# Patient Record
Sex: Female | Born: 1975 | Race: White | Hispanic: No | Marital: Single | Smoking: Current every day smoker
Health system: Southern US, Community
[De-identification: ages and names within clinical notes are randomized; demographics above are authoritative.]

## PROBLEM LIST (undated history)

## (undated) DIAGNOSIS — M1990 Unspecified osteoarthritis, unspecified site: Secondary | ICD-10-CM

## (undated) HISTORY — DX: Unspecified osteoarthritis, unspecified site: M19.90

---

## 2002-11-12 HISTORY — PX: RHINOPLASTY: SHX2354

## 2008-03-25 ENCOUNTER — Other Ambulatory Visit: Admission: RE | Admit: 2008-03-25 | Discharge: 2008-03-25 | Payer: Self-pay | Admitting: Obstetrics and Gynecology

## 2008-06-29 ENCOUNTER — Inpatient Hospital Stay (HOSPITAL_COMMUNITY): Admission: AD | Admit: 2008-06-29 | Discharge: 2008-07-01 | Payer: Self-pay | Admitting: Obstetrics and Gynecology

## 2011-03-27 NOTE — Op Note (Signed)
NAMESHALIN, LINDERS             ACCOUNT NO.:  [id number]   MEDICAL RECORD NO.:  [id number]          PATIENT TYPE:  INP   LOCATION:  9108                          FACILITY:  [facility]   PHYSICIAN:  Gerald Leitz, MD          DATE OF BIRTH:  1976/01/27   DATE OF PROCEDURE:  06/29/2008  DATE OF DISCHARGE:                               OPERATIVE REPORT   PREOPERATIVE DIAGNOSES:  1. Term intrauterine pregnancy.  2. Labor.  3. Nonreassuring fetal heart rate.   POSTOPERATIVE DIAGNOSES:  1. Term intrauterine pregnancy.  2. Labor.  3. Nonreassuring fetal heart rate.   PROCEDURE:  Primary low-transverse cesarean section.   SURGEON:  Gerald Leitz, MD   ASSISTANT:  None.   ANESTHESIA:  Spinal.   FINDINGS:  Female infant, cephalic presentation, OP position, vigorous,  Apgars per neonatologist, arterial pH of 7.22.   SPECIMEN:  Placenta.   DISPOSITION:  Labor and Delivery.   ESTIMATED BLOOD LOSS:  700 mL.   URINE OUTPUT:  300 mL.   FLUIDS:  800 mL.   COMPLICATIONS:  None.   INDICATIONS:  A 35 year old G1 who presented with rupture of membranes  and started on Pitocin for augmentation reached a maximal cervical  dilatation of 4 cm, and developed nonreassuring fetal testing by  repetitive deep variables and late decelerations.   PROCEDURE:  Informed consent was obtained.  The patient was taken to the  operating room where she was placed under spinal anesthesia.  She was  prepped and draped in the usual sterile fashion.  A Pfannenstiel skin  incision was made with a scalpel and carried down to the underlying  layer of fascia.  The fascia was incised in the midline and the incision  was extended laterally with Mayo scissors.  Superior aspect of the  fascial incision was elevated and underlying rectus muscles were  dissected off.  This was repeated on the inferior aspect of the fascial  incision.  The rectus muscles were separated in the midline.  The  peritoneum was identified and entered  bluntly.  Alexis abdominal  retractor was inserted.  Vesicouterine peritoneum was identified, tented  up, and entered sharply with Metzenbaum scissors.  The lower uterine  segment was incised transversely.  Infant's head was delivered  atraumatically.  Mouth and nose were bulb suctioned.  Shoulder and body  were delivered.  Cord was clamped x2 and cut.  Infant was handed off to  the waiting neonatologist.  The placenta was expressed.  The uterus was  exteriorized and cleared of all clots and debris.  Uterine incision was  repaired with 0 Vicryl in a running locked fashion.  The patient was  noted to have some bleeding from the left uterine artery.  An O'Leary  stitch was placed.  There was noted to be a hematoma in the left broad  ligament.  This appeared hemostatic and nonexpanding.  Uterus was then  returned to the abdomen.  Abdomen was closely irrigated, cleared of all  clots and debris.  Uterine incision was inspected and appeared  hemostatic.  Alexis retractor was removed.  The fascia was  reapproximated with 0 PDS.  Scarpa fascia was reapproximated with 2-0  Vicryl in a running fashion.  The skin was closed with staples.  Sponge,  lap, and needle counts were correct x2.  Ancef 2 g were given at cord  clamp.  The patient stated she tolerated the surgery well and was taken  to the recovery room in awake and in stable condition.      Gerald Leitz, MD  Electronically Signed     TC/MEDQ  D:  06/29/2008  T:  06/30/2008  Job:  [id number]

## 2011-03-27 NOTE — Discharge Summary (Signed)
Sara Parks, Sara Parks             ACCOUNT NO.:  [id number]   MEDICAL RECORD NO.:  [id number]          PATIENT TYPE:  INP   LOCATION:  9108                          FACILITY:  [facility]   PHYSICIAN:  Gerald Leitz, MD          DATE OF BIRTH:  [birth year]   DATE OF ADMISSION:  06/29/2008  DATE OF DISCHARGE:  07/01/2008                               DISCHARGE SUMMARY   ADMISSION DIAGNOSES:  1. Term intrauterine pregnancy.  2. Spontaneous rupture of membranes.   DISCHARGE DIAGNOSES:  1. Term intrauterine pregnancy.  2. Spontaneous rupture of membranes.  3. Nonreassuring fetal heart tracing.  4. Status post low-transverse cesarean section.  5. Anemia.   BRIEF HOSPITAL COURSE:  The patient was admitted on June 29, 2008,  after reporting rupture of membranes.  She received Pitocin for  augmentation of labor, progressed to maximum dilatation to 4 cm.  Began  having variable decelerations to the 60s and late decelerations,  underwent cesarean section, delivered a live born female infant with  arterial pH of 7.22 at delivery.  Apgars of 8 and 9 at 1 and 5 minutes  respectively, weighed 3450 grams.  The patient did well postoperatively.  She did develop anemia with a hemoglobin of 9.3 on postop day #1.   She was discharged home on the following medications:  Motrin, Percocet,  and iron sulfate.  She is to return on August 24 to [facility] for staple removal.   ACTIVITY:  Pelvic rest.   CONDITION AT DISCHARGE:  Stable.      Gerald Leitz, MD  Electronically Signed     TC/MEDQ  D:  07/01/2008  T:  07/01/2008  Job:  [id number]

## 2011-03-30 NOTE — Discharge Summary (Signed)
Sara Parks, Sara Parks             ACCOUNT NO.:  [id number]   MEDICAL RECORD NO.:  [id number]          PATIENT TYPE:  INP   LOCATION:  9108                          FACILITY:  [facility]   PHYSICIAN:  Gerald Leitz, MD          DATE OF BIRTH:  1976-06-23   DATE OF ADMISSION:  06/29/2008  DATE OF DISCHARGE:  07/01/2008                               DISCHARGE SUMMARY   ADMISSION DIAGNOSES:  1. Term intrauterine pregnancy.  2. Labor.   DISCHARGE DIAGNOSES:  1. Term labor.  2. Labor.  3. Nonreassuring fetal heart rate.  4. Status post low-transverse cesarean section.  5. Anemia.   BRIEF HOSPITAL COURSE:  The patient was admitted on June 29, 2008, in  late labor, she had spontaneous rupture of membranes at 5:30 on June 29, 2008, and presented to [facility], was admitted.  Her  cervix was 2-3 cm.  She received Pitocin for augmentation.  She  developed late decelerations to the 60s with a maximal cervical  dilatation of 4 cm.  Cesarean section was performed.  She delivered a  live born female infant with Apgars of 8 and 9 at 1 and 5 minutes  respectively.  She did well postoperatively with a hemoglobin on postop  day #1 of 9.3.   She is discharged on the following medications; Motrin and Percocet.   Follow up in 1-2 days for staple removal and in 4-6 weeks for postpartum  visit.   CONDITION AT DISCHARGE:  Stable.      Gerald Leitz, MD  Electronically Signed     TC/MEDQ  D:  07/20/2008  T:  07/20/2008  Job:  [id number]

## 2015-06-03 ENCOUNTER — Telehealth: Payer: Self-pay | Admitting: *Deleted

## 2015-06-03 NOTE — Telephone Encounter (Signed)
Unable to reach patient at time of Pre-Visit Call.  Left message for patient to return call when available.    

## 2015-06-06 ENCOUNTER — Ambulatory Visit (INDEPENDENT_AMBULATORY_CARE_PROVIDER_SITE_OTHER): Payer: BLUE CROSS/BLUE SHIELD | Admitting: Physician Assistant

## 2015-06-06 ENCOUNTER — Ambulatory Visit (HOSPITAL_BASED_OUTPATIENT_CLINIC_OR_DEPARTMENT_OTHER)
Admission: RE | Admit: 2015-06-06 | Discharge: 2015-06-06 | Disposition: A | Discharge status: Home / Self Care | Payer: BLUE CROSS/BLUE SHIELD | Source: Ambulatory Visit | Attending: Physician Assistant | Admitting: Physician Assistant

## 2015-06-06 ENCOUNTER — Encounter: Payer: Self-pay | Admitting: Physician Assistant

## 2015-06-06 VITALS — BP 98/70 | HR 87 | Temp 98.3°F | Ht 65.0 in | Wt 155.0 lb

## 2015-06-06 DIAGNOSIS — R202 Paresthesia of skin: Secondary | ICD-10-CM | POA: Diagnosis not present

## 2015-06-06 DIAGNOSIS — M5441 Lumbago with sciatica, right side: Secondary | ICD-10-CM | POA: Diagnosis not present

## 2015-06-06 DIAGNOSIS — R200 Anesthesia of skin: Secondary | ICD-10-CM | POA: Insufficient documentation

## 2015-06-06 DIAGNOSIS — M25512 Pain in left shoulder: Secondary | ICD-10-CM | POA: Diagnosis present

## 2015-06-06 DIAGNOSIS — G8929 Other chronic pain: Secondary | ICD-10-CM | POA: Insufficient documentation

## 2015-06-06 DIAGNOSIS — M67912 Unspecified disorder of synovium and tendon, left shoulder: Secondary | ICD-10-CM

## 2015-06-06 DIAGNOSIS — M545 Low back pain, unspecified: Secondary | ICD-10-CM | POA: Insufficient documentation

## 2015-06-06 DIAGNOSIS — M5442 Lumbago with sciatica, left side: Secondary | ICD-10-CM

## 2015-06-06 MED ORDER — MELOXICAM 15 MG PO TABS: 15.0000 mg | ORAL_TABLET | Freq: Every day | ORAL | Status: DC

## 2015-06-06 NOTE — Progress Notes (Signed)
Pre visit review using our clinic review tool, if applicable. No additional management support is needed unless otherwise documented below in the visit note. 

## 2015-06-06 NOTE — Patient Instructions (Signed)
Please go downstairs for imaging. I will call you with your results.  For shoulder and low back, please limit heavy lifting.  Apply topical Aspercreme to lower back and shoulder. Take Mobic daily as directed. Use Tylenol if needed for breakthrough symptoms.  We will likely need to proceed with MRI lower spine, but must get x-ray first.  Sciatica with Rehab The sciatic nerve runs from the back down the leg and is responsible for sensation and control of the muscles in the back (posterior) side of the thigh, lower leg, and foot. Sciatica is a condition that is characterized by inflammation of this nerve.  SYMPTOMS   Signs of nerve damage, including numbness and/or weakness along the posterior side of the lower extremity.  Pain in the back of the thigh that may also travel down the leg.  Pain that worsens when sitting for long periods of time.  Occasionally, pain in the back or buttock. CAUSES  Inflammation of the sciatic nerve is the cause of sciatica. The inflammation is due to something irritating the nerve. Common sources of irritation include:  Sitting for long periods of time.  Direct trauma to the nerve.  Arthritis of the spine.  Herniated or ruptured disk.  Slipping of the vertebrae (spondylolisthesis).  Pressure from soft tissues, such as muscles or ligament-like tissue (fascia). RISK INCREASES WITH:  Sports that place pressure or stress on the spine (football or weightlifting).  Poor strength and flexibility.  Failure to warm up properly before activity.  Family history of low back pain or disk disorders.  Previous back injury or surgery.  Poor body mechanics, especially when lifting, or poor posture. PREVENTION   Warm up and stretch properly before activity.  Maintain physical fitness:  Strength, flexibility, and endurance.  Cardiovascular fitness.  Learn and use proper technique, especially with posture and lifting. When possible, have coach correct  improper technique.  Avoid activities that place stress on the spine. PROGNOSIS If treated properly, then sciatica usually resolves within 6 weeks. However, occasionally surgery is necessary.  RELATED COMPLICATIONS   Permanent nerve damage, including pain, numbness, tingle, or weakness.  Chronic back pain.  Risks of surgery: infection, bleeding, nerve damage, or damage to surrounding tissues. TREATMENT Treatment initially involves resting from any activities that aggravate your symptoms. The use of ice and medication may help reduce pain and inflammation. The use of strengthening and stretching exercises may help reduce pain with activity. These exercises may be performed at home or with referral to a therapist. A therapist may recommend further treatments, such as transcutaneous electronic nerve stimulation (TENS) or ultrasound. Your caregiver may recommend corticosteroid injections to help reduce inflammation of the sciatic nerve. If symptoms persist despite non-surgical (conservative) treatment, then surgery may be recommended. MEDICATION  If pain medication is necessary, then nonsteroidal anti-inflammatory medications, such as aspirin and ibuprofen, or other minor pain relievers, such as acetaminophen, are often recommended.  Do not take pain medication for 7 days before surgery.  Prescription pain relievers may be given if deemed necessary by your caregiver. Use only as directed and only as much as you need.  Ointments applied to the skin may be helpful.  Corticosteroid injections may be given by your caregiver. These injections should be reserved for the most serious cases, because they may only be given a certain number of times. HEAT AND COLD  Cold treatment (icing) relieves pain and reduces inflammation. Cold treatment should be applied for 10 to 15 minutes every 2 to 3 hours for  inflammation and pain and immediately after any activity that aggravates your symptoms. Use ice packs  or massage the area with a piece of ice (ice massage).  Heat treatment may be used prior to performing the stretching and strengthening activities prescribed by your caregiver, physical therapist, or athletic trainer. Use a heat pack or soak the injury in warm water. SEEK MEDICAL CARE IF:  Treatment seems to offer no benefit, or the condition worsens.  Any medications produce adverse side effects. EXERCISES  RANGE OF MOTION (ROM) AND STRETCHING EXERCISES - Sciatica Most people with sciatic will find that their symptoms worsen with either excessive bending forward (flexion) or arching at the low back (extension). The exercises which will help resolve your symptoms will focus on the opposite motion. Your physician, physical therapist or athletic trainer will help you determine which exercises will be most helpful to resolve your low back pain. Do not complete any exercises without first consulting with your clinician. Discontinue any exercises which worsen your symptoms until you speak to your clinician. If you have pain, numbness or tingling which travels down into your buttocks, leg or foot, the goal of the therapy is for these symptoms to move closer to your back and eventually resolve. Occasionally, these leg symptoms will get better, but your low back pain may worsen; this is typically an indication of progress in your rehabilitation. Be certain to be very alert to any changes in your symptoms and the activities in which you participated in the 24 hours prior to the change. Sharing this information with your clinician will allow him/her to most efficiently treat your condition. These exercises may help you when beginning to rehabilitate your injury. Your symptoms may resolve with or without further involvement from your physician, physical therapist or athletic trainer. While completing these exercises, remember:   Restoring tissue flexibility helps normal motion to return to the joints. This allows  healthier, less painful movement and activity.  An effective stretch should be held for at least 30 seconds.  A stretch should never be painful. You should only feel a gentle lengthening or release in the stretched tissue. FLEXION RANGE OF MOTION AND STRETCHING EXERCISES: STRETCH - Flexion, Single Knee to Chest   Lie on a firm bed or floor with both legs extended in front of you.  Keeping one leg in contact with the floor, bring your opposite knee to your chest. Hold your leg in place by either grabbing behind your thigh or at your knee.  Pull until you feel a gentle stretch in your low back. Hold __________ seconds.  Slowly release your grasp and repeat the exercise with the opposite side. Repeat __________ times. Complete this exercise __________ times per day.  STRETCH - Flexion, Double Knee to Chest  Lie on a firm bed or floor with both legs extended in front of you.  Keeping one leg in contact with the floor, bring your opposite knee to your chest.  Tense your stomach muscles to support your back and then lift your other knee to your chest. Hold your legs in place by either grabbing behind your thighs or at your knees.  Pull both knees toward your chest until you feel a gentle stretch in your low back. Hold __________ seconds.  Tense your stomach muscles and slowly return one leg at a time to the floor. Repeat __________ times. Complete this exercise __________ times per day.  STRETCH - Low Trunk Rotation   Lie on a firm bed or floor. Keeping  your legs in front of you, bend your knees so they are both pointed toward the ceiling and your feet are flat on the floor.  Extend your arms out to the side. This will stabilize your upper body by keeping your shoulders in contact with the floor.  Gently and slowly drop both knees together to one side until you feel a gentle stretch in your low back. Hold for __________ seconds.  Tense your stomach muscles to support your low back as  you bring your knees back to the starting position. Repeat the exercise to the other side. Repeat __________ times. Complete this exercise __________ times per day  EXTENSION RANGE OF MOTION AND FLEXIBILITY EXERCISES: STRETCH - Extension, Prone on Elbows  Lie on your stomach on the floor, a bed will be too soft. Place your palms about shoulder width apart and at the height of your head.  Place your elbows under your shoulders. If this is too painful, stack pillows under your chest.  Allow your body to relax so that your hips drop lower and make contact more completely with the floor.  Hold this position for __________ seconds.  Slowly return to lying flat on the floor. Repeat __________ times. Complete this exercise __________ times per day.  RANGE OF MOTION - Extension, Prone Press Ups  Lie on your stomach on the floor, a bed will be too soft. Place your palms about shoulder width apart and at the height of your head.  Keeping your back as relaxed as possible, slowly straighten your elbows while keeping your hips on the floor. You may adjust the placement of your hands to maximize your comfort. As you gain motion, your hands will come more underneath your shoulders.  Hold this position __________ seconds.  Slowly return to lying flat on the floor. Repeat __________ times. Complete this exercise __________ times per day.  STRENGTHENING EXERCISES - Sciatica  These exercises may help you when beginning to rehabilitate your injury. These exercises should be done near your "sweet spot." This is the neutral, low-back arch, somewhere between fully rounded and fully arched, that is your least painful position. When performed in this safe range of motion, these exercises can be used for people who have either a flexion or extension based injury. These exercises may resolve your symptoms with or without further involvement from your physician, physical therapist or athletic trainer. While completing  these exercises, remember:   Muscles can gain both the endurance and the strength needed for everyday activities through controlled exercises.  Complete these exercises as instructed by your physician, physical therapist or athletic trainer. Progress with the resistance and repetition exercises only as your caregiver advises.  You may experience muscle soreness or fatigue, but the pain or discomfort you are trying to eliminate should never worsen during these exercises. If this pain does worsen, stop and make certain you are following the directions exactly. If the pain is still present after adjustments, discontinue the exercise until you can discuss the trouble with your clinician. STRENGTHENING - Deep Abdominals, Pelvic Tilt   Lie on a firm bed or floor. Keeping your legs in front of you, bend your knees so they are both pointed toward the ceiling and your feet are flat on the floor.  Tense your lower abdominal muscles to press your low back into the floor. This motion will rotate your pelvis so that your tail bone is scooping upwards rather than pointing at your feet or into the floor.  With a  gentle tension and even breathing, hold this position for __________ seconds. Repeat __________ times. Complete this exercise __________ times per day.  STRENGTHENING - Abdominals, Crunches   Lie on a firm bed or floor. Keeping your legs in front of you, bend your knees so they are both pointed toward the ceiling and your feet are flat on the floor. Cross your arms over your chest.  Slightly tip your chin down without bending your neck.  Tense your abdominals and slowly lift your trunk high enough to just clear your shoulder blades. Lifting higher can put excessive stress on the low back and does not further strengthen your abdominal muscles.  Control your return to the starting position. Repeat __________ times. Complete this exercise __________ times per day.  STRENGTHENING - Quadruped, Opposite  UE/LE Lift  Assume a hands and knees position on a firm surface. Keep your hands under your shoulders and your knees under your hips. You may place padding under your knees for comfort.  Find your neutral spine and gently tense your abdominal muscles so that you can maintain this position. Your shoulders and hips should form a rectangle that is parallel with the floor and is not twisted.  Keeping your trunk steady, lift your right hand no higher than your shoulder and then your left leg no higher than your hip. Make sure you are not holding your breath. Hold this position __________ seconds.  Continuing to keep your abdominal muscles tense and your back steady, slowly return to your starting position. Repeat with the opposite arm and leg. Repeat __________ times. Complete this exercise __________ times per day.  STRENGTHENING - Abdominals and Quadriceps, Straight Leg Raise   Lie on a firm bed or floor with both legs extended in front of you.  Keeping one leg in contact with the floor, bend the other knee so that your foot can rest flat on the floor.  Find your neutral spine, and tense your abdominal muscles to maintain your spinal position throughout the exercise.  Slowly lift your straight leg off the floor about 6 inches for a count of 15, making sure to not hold your breath.  Still keeping your neutral spine, slowly lower your leg all the way to the floor. Repeat this exercise with each leg __________ times. Complete this exercise __________ times per day. POSTURE AND BODY MECHANICS CONSIDERATIONS - Sciatica Keeping correct posture when sitting, standing or completing your activities will reduce the stress put on different body tissues, allowing injured tissues a chance to heal and limiting painful experiences. The following are general guidelines for improved posture. Your physician or physical therapist will provide you with any instructions specific to your needs. While reading these  guidelines, remember:  The exercises prescribed by your provider will help you have the flexibility and strength to maintain correct postures.  The correct posture provides the optimal environment for your joints to work. All of your joints have less wear and tear when properly supported by a spine with good posture. This means you will experience a healthier, less painful body.  Correct posture must be practiced with all of your activities, especially prolonged sitting and standing. Correct posture is as important when doing repetitive low-stress activities (typing) as it is when doing a single heavy-load activity (lifting). RESTING POSITIONS Consider which positions are most painful for you when choosing a resting position. If you have pain with flexion-based activities (sitting, bending, stooping, squatting), choose a position that allows you to rest in a less  flexed posture. You would want to avoid curling into a fetal position on your side. If your pain worsens with extension-based activities (prolonged standing, working overhead), avoid resting in an extended position such as sleeping on your stomach. Most people will find more comfort when they rest with their spine in a more neutral position, neither too rounded nor too arched. Lying on a non-sagging bed on your side with a pillow between your knees, or on your back with a pillow under your knees will often provide some relief. Keep in mind, being in any one position for a prolonged period of time, no matter how correct your posture, can still lead to stiffness. PROPER SITTING POSTURE In order to minimize stress and discomfort on your spine, you must sit with correct posture Sitting with good posture should be effortless for a healthy body. Returning to good posture is a gradual process. Many people can work toward this most comfortably by using various supports until they have the flexibility and strength to maintain this posture on their  own. When sitting with proper posture, your ears will fall over your shoulders and your shoulders will fall over your hips. You should use the back of the chair to support your upper back. Your low back will be in a neutral position, just slightly arched. You may place a small pillow or folded towel at the base of your low back for support.  When working at a desk, create an environment that supports good, upright posture. Without extra support, muscles fatigue and lead to excessive strain on joints and other tissues. Keep these recommendations in mind: CHAIR:   A chair should be able to slide under your desk when your back makes contact with the back of the chair. This allows you to work closely.  The chair's height should allow your eyes to be level with the upper part of your monitor and your hands to be slightly lower than your elbows. BODY POSITION  Your feet should make contact with the floor. If this is not possible, use a foot rest.  Keep your ears over your shoulders. This will reduce stress on your neck and low back. INCORRECT SITTING POSTURES   If you are feeling tired and unable to assume a healthy sitting posture, do not slouch or slump. This puts excessive strain on your back tissues, causing more damage and pain. Healthier options include:  Using more support, like a lumbar pillow.  Switching tasks to something that requires you to be upright or walking.  Talking a brief walk.  Lying down to rest in a neutral-spine position. PROLONGED STANDING WHILE SLIGHTLY LEANING FORWARD  When completing a task that requires you to lean forward while standing in one place for a long time, place either foot up on a stationary 2-4 inch high object to help maintain the best posture. When both feet are on the ground, the low back tends to lose its slight inward curve. If this curve flattens (or becomes too large), then the back and your other joints will experience too much stress, fatigue more  quickly and can cause pain.  CORRECT STANDING POSTURES Proper standing posture should be assumed with all daily activities, even if they only take a few moments, like when brushing your teeth. As in sitting, your ears should fall over your shoulders and your shoulders should fall over your hips. You should keep a slight tension in your abdominal muscles to brace your spine. Your tailbone should point down to the  ground, not behind your body, resulting in an over-extended swayback posture.  INCORRECT STANDING POSTURES  Common incorrect standing postures include a forward head, locked knees and/or an excessive swayback. WALKING Walk with an upright posture. Your ears, shoulders and hips should all line-up. PROLONGED ACTIVITY IN A FLEXED POSITION When completing a task that requires you to bend forward at your waist or lean over a low surface, try to find a way to stabilize 3 of 4 of your limbs. You can place a hand or elbow on your thigh or rest a knee on the surface you are reaching across. This will provide you more stability so that your muscles do not fatigue as quickly. By keeping your knees relaxed, or slightly bent, you will also reduce stress across your low back. CORRECT LIFTING TECHNIQUES DO :   Assume a wide stance. This will provide you more stability and the opportunity to get as close as possible to the object which you are lifting.  Tense your abdominals to brace your spine; then bend at the knees and hips. Keeping your back locked in a neutral-spine position, lift using your leg muscles. Lift with your legs, keeping your back straight.  Test the weight of unknown objects before attempting to lift them.  Try to keep your elbows locked down at your sides in order get the best strength from your shoulders when carrying an object.  Always ask for help when lifting heavy or awkward objects. INCORRECT LIFTING TECHNIQUES DO NOT:   Lock your knees when lifting, even if it is a small  object.  Bend and twist. Pivot at your feet or move your feet when needing to change directions.  Assume that you cannot safely pick up a paperclip without proper posture. Document Released: 10/29/2005 Document Revised: 03/15/2014 Document Reviewed: 02/10/2009 [facility] Patient Information 2015 [place], [place]. This information is not intended to replace advice given to you by your health care provider. Make sure you discuss any questions you have with your health care provider.

## 2015-06-06 NOTE — Assessment & Plan Note (Signed)
We'll obtain x-ray of left shoulder due to chronicity of symptoms area Rx meloxicam once daily. Extra strength Tylenol as directed for breakthrough pain. Avoid heavy lifting or over exertion. Ice and topical Aspercreme to the affected area. We'll alter regimen based on x-ray findings

## 2015-06-06 NOTE — Assessment & Plan Note (Signed)
With symptoms of intermittent nerve irritation/compression. Will obtain x-ray of lumbar spine. We'll likely proceed quickly with MRI. Stretching exercises reviewed. Rx meloxicam to take once daily. No heavy lifting or over exertion. Follow-up will be based on results.

## 2015-06-06 NOTE — Progress Notes (Signed)
Patient presents to clinic today to establish care.  Acute Concerns: Patient complains of left shoulder pain 6 months that is achy in nature, sometimes radiates into elevated. Patient endorses pain with range of motion , but denies decreased range of motion. Denies trauma neck. Denies numbness, tingling or weakness of extremities. Has taken Advil when necessary for symptoms.   patient also complains of intermittent bilateral lower extremity tingling sometimes associated with low back pain. Denies trauma or injury to lower back. Denies saddle anesthesia or change to bowel or bladder habits. Denies prior imaging of lower back. Patient endorses symptoms have been present for about 4 months now.  Past Medical History  Diagnosis Date  . Arthritis     In feet    Past Surgical History  Procedure Laterality Date  . Rhinoplasty  2004  . Cesarean section      No current outpatient prescriptions on file prior to visit.   No current facility-administered medications on file prior to visit.    Allergies  Allergen Reactions  . Tape Rash    Medical-Bandaids    Family History  Problem Relation Age of Onset  . Diabetes Mother   . Lupus Mother   . Colitis Mother   . Rheum arthritis Mother   . Stroke Father   . Colitis Brother   . ADD / ADHD Son   . Arthritis Maternal Grandmother   . Cirrhosis Maternal Grandmother   . Diabetes Maternal Grandfather   . Cancer Maternal Grandfather     Bladder  . Diabetes Paternal Grandmother   . Cancer Maternal Aunt     Breast    History   Social History  . Marital Status: Single    Spouse Name: N/A  . Number of Children: 1  . Years of Education: N/A   Occupational History  . Supervisor     Social History Main Topics  . Smoking status: Current Every Day Smoker -- 0.50 packs/day    Types: Cigarettes    Start date: 06/06/1995  . Smokeless tobacco: Never Used  . Alcohol Use: No  . Drug Use: No  . Sexual Activity:    Partners: Male    Other Topics Concern  . Not on file   Social History Narrative   Review of Systems  Constitutional: Negative for fever, chills, weight loss and malaise/fatigue.  Musculoskeletal: Positive for back pain and joint pain. Negative for myalgias and falls.  Neurological: Positive for tingling. Negative for dizziness and loss of consciousness.    BP 98/70 mmHg  Pulse 87  Temp(Src) 98.3 F (36.8 C) (Oral)  Ht  (1.651 m)  Wt 155 lb (70.308 kg)  BMI 25.79 kg/m2  SpO2 99%  LMP 06/03/2015  Physical Exam  Constitutional: She is oriented to person, place, and time and well-developed, well-nourished, and in no distress.  HENT:  Head: Normocephalic and atraumatic.  Eyes: Conjunctivae are normal.  Cardiovascular: Normal rate, regular rhythm, normal heart sounds and intact distal pulses.   Pulmonary/Chest: Effort normal and breath sounds normal. No respiratory distress. She has no wheezes. She has no rales. She exhibits no tenderness.  Musculoskeletal:       Left shoulder: She exhibits pain. She exhibits normal range of motion, no tenderness, no bony tenderness, no spasm, normal pulse and normal strength.       Lumbar back: Normal.  Neurological: She is alert and oriented to person, place, and time.  Skin: Skin is warm and dry. No rash noted.  Psychiatric:  Affect normal.  Vitals reviewed.  Assessment/Plan: Lumbago  With symptoms of intermittent nerve irritation/compression. Will obtain x-ray of lumbar spine. We'll likely proceed quickly with MRI. Stretching exercises reviewed. Rx meloxicam to take once daily. No heavy lifting or over exertion. Follow-up will be based on results.  Tendinopathy of left rotator cuff  We'll obtain x-ray of left shoulder due to chronicity of symptoms area Rx meloxicam once daily. Extra strength Tylenol as directed for breakthrough pain. Avoid heavy lifting or over exertion. Ice and topical Aspercreme to the affected area. We'll alter regimen based on x-ray  findings

## 2015-06-07 ENCOUNTER — Telehealth: Payer: Self-pay | Admitting: *Deleted

## 2015-06-07 NOTE — Telephone Encounter (Signed)
Called and spoke with the pt and informed her of recent x-ray results and note.  Pt verbalized understanding.  Pt was scheduled an follow-up appt for Tues (06-21-15 @ 10:15am).//AB/CMA

## 2015-06-07 NOTE — Telephone Encounter (Signed)
-----   Message from Waldon Merl, PA-C sent at 06/06/2015 12:57 PM EDT ----- X-ray of lumbar spine and shoulder are negative. Continue care discussed at visit. I do still recommend MRI lumbar spine giving her symptoms. Please let me know if she is willing to proceed and I will order. Follow-up 2 weeks for shoulder.

## 2015-06-21 ENCOUNTER — Encounter: Payer: Self-pay | Admitting: Physician Assistant

## 2015-06-21 ENCOUNTER — Ambulatory Visit (INDEPENDENT_AMBULATORY_CARE_PROVIDER_SITE_OTHER): Payer: BLUE CROSS/BLUE SHIELD | Admitting: Physician Assistant

## 2015-06-21 VITALS — BP 110/70 | HR 85 | Temp 99.1°F | Ht 65.0 in | Wt 155.0 lb

## 2015-06-21 DIAGNOSIS — M5416 Radiculopathy, lumbar region: Secondary | ICD-10-CM

## 2015-06-21 DIAGNOSIS — G8929 Other chronic pain: Secondary | ICD-10-CM

## 2015-06-21 DIAGNOSIS — M25512 Pain in left shoulder: Secondary | ICD-10-CM

## 2015-06-21 MED ORDER — METHYLPREDNISOLONE 4 MG PO TBPK: ORAL_TABLET | ORAL | Status: DC

## 2015-06-21 MED ORDER — HYDROCODONE-ACETAMINOPHEN 10-325 MG PO TABS: 1.0000 | ORAL_TABLET | Freq: Three times a day (TID) | ORAL | Status: DC | PRN

## 2015-06-21 NOTE — Progress Notes (Signed)
Pre visit review using our clinic review tool, if applicable. No additional management support is needed unless otherwise documented below in the visit note. 

## 2015-06-21 NOTE — Assessment & Plan Note (Signed)
MRI order placed. Will begin medrol pack. Rx Norco. Supportive measures reviewed. Alarm signs/symptoms discussed with patient. ER if any of these occur.

## 2015-06-21 NOTE — Progress Notes (Signed)
   Patient presents to clinic today for follow-up of tendinopathy of L rotator cuff and lumbar radiculopathy symptoms. Patient denies any improvement with either issue with supportive measures and Mobic. Patient endorses continued pain in L shoulder with rotation and abduction. Again denies trauma or injury. Endorses taking Mobic as directed but the medication makes her feel sick. X-ray left shoulder unremarkable. Patient endorses resolution of back pain but states tingling of feet bilaterally has persisted. Also noting some weakness. Denies saddle anesthesia or change to bowel/bladder habits.  Past Medical History  Diagnosis Date  . Arthritis     In feet    No current outpatient prescriptions on file prior to visit.   No current facility-administered medications on file prior to visit.    Allergies  Allergen Reactions  . Tape Rash    Medical-Bandaids    Family History  Problem Relation Age of Onset  . Diabetes Mother   . Lupus Mother   . Colitis Mother   . Rheum arthritis Mother   . Stroke Father   . Colitis Brother   . ADD / ADHD Son   . Arthritis Maternal Grandmother   . Cirrhosis Maternal Grandmother   . Diabetes Maternal Grandfather   . Cancer Maternal Grandfather     Bladder  . Diabetes Paternal Grandmother   . Cancer Maternal Aunt     Breast    History   Social History  . Marital Status: Single    Spouse Name: N/A  . Number of Children: 1  . Years of Education: N/A   Occupational History  . Supervisor     Social History Main Topics  . Smoking status: Current Every Day Smoker -- 0.50 packs/day    Types: Cigarettes    Start date: 06/06/1995  . Smokeless tobacco: Never Used  . Alcohol Use: No  . Drug Use: No  . Sexual Activity:    Partners: Male   Other Topics Concern  . None   Social History Narrative   Review of Systems - See HPI.  All other ROS are negative.  BP 110/70 mmHg  Pulse 85  Temp(Src) 99.1 F (37.3 C) (Oral)  Ht  (1.651 m)   Wt 155 lb (70.308 kg)  BMI 25.79 kg/m2  SpO2 98%  LMP 06/03/2015  Physical Exam  Constitutional: She is oriented to person, place, and time and well-developed, well-nourished, and in no distress.  HENT:  Head: Normocephalic and atraumatic.  Cardiovascular: Normal rate, regular rhythm, normal heart sounds and intact distal pulses.   Pulmonary/Chest: Effort normal and breath sounds normal. No respiratory distress. She has no wheezes. She has no rales. She exhibits no tenderness.  Musculoskeletal:       Left shoulder: She exhibits decreased range of motion, tenderness, pain and spasm.       Thoracic back: Normal.       Lumbar back: Normal.  Neurological: She is alert and oriented to person, place, and time.  Skin: Skin is warm and dry. No rash noted.  Psychiatric: Affect normal.  Vitals reviewed.  Assessment/Plan: Chronic left shoulder pain Continue supportive measures. Discussed MRI versus Sports Medicine evaluation. Patient elects to see Sports Medicine. Referral placed. Rx Norco.  Bilateral lumbar radiculopathy MRI order placed. Will begin medrol pack. Rx Norco. Supportive measures reviewed. Alarm signs/symptoms discussed with patient. ER if any of these occur.

## 2015-06-21 NOTE — Patient Instructions (Signed)
Please take the Medrol pack as directed. Use Norco as directed for severe pain. Continue supportive measures discussed at last visit.  You will be contacted for MRI of your lower back. You will also be contacted for assessment by Dr. Milana Na office (sports medicine) for your shoulder.  If anything acute worsens, please return to clinic.  Otherwise we will follow-up based on MRI results.

## 2015-06-21 NOTE — Assessment & Plan Note (Signed)
Continue supportive measures. Discussed MRI versus Sports Medicine evaluation. Patient elects to see Sports Medicine. Referral placed. Rx Norco.

## 2015-06-25 ENCOUNTER — Ambulatory Visit (HOSPITAL_BASED_OUTPATIENT_CLINIC_OR_DEPARTMENT_OTHER)
Admission: RE | Admit: 2015-06-25 | Discharge: 2015-06-25 | Disposition: A | Discharge status: Home / Self Care | Payer: BLUE CROSS/BLUE SHIELD | Source: Ambulatory Visit | Attending: Physician Assistant | Admitting: Physician Assistant

## 2015-06-25 DIAGNOSIS — M5416 Radiculopathy, lumbar region: Secondary | ICD-10-CM

## 2015-06-25 DIAGNOSIS — M4806 Spinal stenosis, lumbar region: Secondary | ICD-10-CM | POA: Diagnosis not present

## 2015-06-25 DIAGNOSIS — M5126 Other intervertebral disc displacement, lumbar region: Secondary | ICD-10-CM | POA: Insufficient documentation

## 2015-06-28 ENCOUNTER — Telehealth: Payer: Self-pay | Admitting: Physician Assistant

## 2015-06-28 ENCOUNTER — Telehealth: Payer: Self-pay | Admitting: *Deleted

## 2015-06-28 DIAGNOSIS — M48061 Spinal stenosis, lumbar region without neurogenic claudication: Secondary | ICD-10-CM

## 2015-06-28 NOTE — Telephone Encounter (Signed)
Pt returning call for MRI results. Call back # [phone].

## 2015-06-28 NOTE — Telephone Encounter (Signed)
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 I would like to set her up quickly with Neurosurgery for assessment and treatment so that this does not worsen.  Is she ok with me placing referral? I highly recommend it.

## 2015-06-28 NOTE — Telephone Encounter (Signed)
Called and spoke with the pt and informed her of recent MRI results and note.  Pt verbalized understanding and agreed to referral to the Neurosurgery.//AB/CMA

## 2015-06-28 NOTE — Telephone Encounter (Signed)
Error//AB/CMA 

## 2015-06-29 ENCOUNTER — Encounter: Payer: Self-pay | Admitting: Family Medicine

## 2015-06-29 ENCOUNTER — Ambulatory Visit (INDEPENDENT_AMBULATORY_CARE_PROVIDER_SITE_OTHER): Payer: BLUE CROSS/BLUE SHIELD | Admitting: Family Medicine

## 2015-06-29 VITALS — BP 114/76 | HR 97 | Ht 65.0 in | Wt 155.0 lb

## 2015-06-29 DIAGNOSIS — M67912 Unspecified disorder of synovium and tendon, left shoulder: Secondary | ICD-10-CM

## 2015-06-29 DIAGNOSIS — M25512 Pain in left shoulder: Secondary | ICD-10-CM

## 2015-06-29 MED ORDER — METHYLPREDNISOLONE ACETATE 40 MG/ML IJ SUSP
40.0000 mg | Freq: Once | INTRAMUSCULAR | Status: AC
  Administered 2015-06-29: 40 mg via INTRA_ARTICULAR

## 2015-06-29 NOTE — Patient Instructions (Signed)
You have rotator cuff impingement Try to avoid painful activities (overhead activities, lifting with extended arm) as much as possible. Consider aleve 2 tabs twice a day with food OR ibuprofen 3 tabs three times a day with food for pain and inflammation. Can take tylenol in addition to this. Subacromial injection may be beneficial to help with pain and to decrease inflammation - you were given this today. Consider physical therapy with transition to home exercise program. Do home exercise program with theraband and scapular stabilization exercises daily - these are very important for long term relief even if an injection was given. 3 sets of 10 once a day. If not improving at follow-up we will consider further imaging, physical therapy, and/or nitro patches. Follow up with me in 6 weeks though you can call me if you want to do any of these sooner.

## 2015-06-29 NOTE — Telephone Encounter (Signed)
Referral placed.

## 2015-07-01 NOTE — Assessment & Plan Note (Signed)
2/2 rotator cuff impingement.  Given injection today.  Shown home exercises to do daily.  Consider nsaids, physical therapy, nitro patches, further imaging.  F/u in 6 weeks.  After informed written consent, patient was seated on exam table. Left shoulder was prepped with alcohol swab and utilizing posterior approach, patient's left subacromial space was injected with 3:1 marcaine: depomedrol. Patient tolerated the procedure well without immediate complications.

## 2015-07-01 NOTE — Progress Notes (Signed)
PCP and referred by: Piedad Climes, PA-C  Subjective:   HPI: Patient is a 39 y.o. female here for left shoulder pain.  Patient denies known injury or trauma. 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 Tried mobic, hydrocodone, steroid pack without much benefit.  Did not tolerate the mobic. + night pain. Pain mainly lateral shoulder but can radiate up neck. No numbness/tingling. No prior issues with this shoulder. Left handed.  Past Medical History  Diagnosis Date  . 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 HYDROcodone-acetaminophen (NORCO) 10-325 MG per tablet Take 1 tablet by mouth every 8 (eight) hours as needed. 30 tablet 0  . methylPREDNISolone (MEDROL DOSEPAK) 4 MG TBPK tablet Take following packaged directions. 21 tablet 0   No current facility-administered medications on file prior to visit.    Past Surgical History  Procedure Laterality Date  . Rhinoplasty  2004  . Cesarean section      Allergies  Allergen Reactions  . Tape Rash    Medical-Bandaids    Social History   Social History  . Marital Status: Single    Spouse Name: N/A  . Number of Children: 1  . Years of Education: N/A   Occupational History  . Supervisor     Social History Main Topics  . Smoking status: Current Every Day Smoker -- 0.50 packs/day    Types: Cigarettes    Start date: 06/06/1995  . Smokeless tobacco: Never Used  . Alcohol Use: No  . Drug Use: No  . Sexual Activity:    Partners: Male   Other Topics Concern  . Not on file   Social History Narrative    Family History  Problem Relation Age of Onset  . Diabetes Mother   . Lupus Mother   . Colitis Mother   . Rheum arthritis Mother   . Stroke Father   . Colitis Brother   . ADD / ADHD Son   . Arthritis Maternal Grandmother   . Cirrhosis Maternal Grandmother   . Diabetes Maternal  Grandfather   . Cancer Maternal Grandfather     Bladder  . Diabetes Paternal Grandmother   . Cancer Maternal Aunt     Breast    BP 114/76 mmHg  Pulse 97  Ht  (1.651 m)  Wt 155 lb (70.308 kg)  BMI 25.79 kg/m2  LMP 06/03/2015  Review of Systems: See HPI above.    Objective:  Physical Exam:  Gen: NAD  Left shoulder: No swelling, ecchymoses.  No gross deformity. No TTP. FROM with painful arc. Positive Hawkins, Neers. Negative Speeds, Yergasons. Strength 5/5 with empty can and resisted internal/external rotation.  Painful empty can. Negative apprehension. NV intact distally.    Assessment & Plan:  1. Left shoulder pain - 2/2 rotator cuff impingement.  Given injection today.  Shown home exercises to do daily.  Consider nsaids, physical therapy, nitro patches, further imaging.  F/u in 6 weeks.  After informed written consent, patient was seated on exam table. Left shoulder was prepped with alcohol swab and utilizing posterior approach, patient's left subacromial space was injected with 3:1 marcaine: depomedrol. Patient tolerated the procedure well without immediate complications.

## 2015-08-10 ENCOUNTER — Ambulatory Visit: Payer: BLUE CROSS/BLUE SHIELD | Admitting: Family Medicine

## 2015-09-16 ENCOUNTER — Encounter: Payer: Self-pay | Admitting: Physician Assistant

## 2015-09-16 ENCOUNTER — Ambulatory Visit (INDEPENDENT_AMBULATORY_CARE_PROVIDER_SITE_OTHER): Payer: BLUE CROSS/BLUE SHIELD | Admitting: Physician Assistant

## 2015-09-16 VITALS — BP 113/71 | HR 91 | Temp 98.1°F | Resp 16 | Ht 65.0 in | Wt 156.5 lb

## 2015-09-16 DIAGNOSIS — R109 Unspecified abdominal pain: Secondary | ICD-10-CM | POA: Diagnosis not present

## 2015-09-16 DIAGNOSIS — R5382 Chronic fatigue, unspecified: Secondary | ICD-10-CM | POA: Diagnosis not present

## 2015-09-16 DIAGNOSIS — N910 Primary amenorrhea: Secondary | ICD-10-CM

## 2015-09-16 DIAGNOSIS — N926 Irregular menstruation, unspecified: Secondary | ICD-10-CM | POA: Insufficient documentation

## 2015-09-16 LAB — CBC
HCT: 40.6 % (ref 36.0–46.0)
Hemoglobin: 13.3 g/dL (ref 12.0–15.0)
MCHC: 32.7 g/dL (ref 30.0–36.0)
MCV: 91.8 fl (ref 78.0–100.0)
Platelets: 348 10*3/uL (ref 150.0–400.0)
RBC: 4.43 Mil/uL (ref 3.87–5.11)
RDW: 13.7 % (ref 11.5–15.5)
WBC: 13.6 10*3/uL — AB (ref 4.0–10.5)

## 2015-09-16 LAB — COMPREHENSIVE METABOLIC PANEL
ALK PHOS: 64 U/L (ref 39–117)
ALT: 12 U/L (ref 0–35)
AST: 12 U/L (ref 0–37)
Albumin: 3.8 g/dL (ref 3.5–5.2)
BILIRUBIN TOTAL: 0.2 mg/dL (ref 0.2–1.2)
BUN: 12 mg/dL (ref 6–23)
CO2: 27 mEq/L (ref 19–32)
Calcium: 9.3 mg/dL (ref 8.4–10.5)
Chloride: 108 mEq/L (ref 96–112)
Creatinine, Ser: 0.72 mg/dL (ref 0.40–1.20)
GFR: 95.86 mL/min (ref >60.00)
GLUCOSE: 104 mg/dL — AB (ref 70–99)
Potassium: 3.9 mEq/L (ref 3.5–5.1)
SODIUM: 140 meq/L (ref 135–145)
TOTAL PROTEIN: 6.3 g/dL (ref 6.0–8.3)

## 2015-09-16 LAB — POCT URINE PREGNANCY: PREG TEST UR: NEGATIVE

## 2015-09-16 LAB — TSH: TSH: 1.11 u[IU]/mL (ref 0.35–4.50)

## 2015-09-16 LAB — VITAMIN D 25 HYDROXY (VIT D DEFICIENCY, FRACTURES): VITD: 15.73 ng/mL — ABNORMAL LOW (ref 30.00–100.00)

## 2015-09-16 NOTE — Progress Notes (Signed)
   Patient presents to clinic today c/o 1 month of lower abdominal cramping associated with nausea and delayed menstrual period after suffering from a stomach bug. Denies fever, chills, emesis. Endorses significant fatigue. Nausea is now intermittent but significant. Endorses bloating. Endorses regular bowel movements without tenesmus, melena or hematochezia. LMP 08/01/15 -- notes only spotting in October lasting 1.5 days. Endorses taking home pregnancy tests with negative results.  Past Medical History  Diagnosis Date  . Arthritis     In feet    No current outpatient prescriptions on file prior to visit.   No current facility-administered medications on file prior to visit.    Allergies  Allergen Reactions  . Tape Rash    Medical-Bandaids    Family History  Problem Relation Age of Onset  . Diabetes Mother   . Lupus Mother   . Colitis Mother   . Rheum arthritis Mother   . Stroke Father   . Colitis Brother   . ADD / ADHD Son   . Arthritis Maternal Grandmother   . Cirrhosis Maternal Grandmother   . Diabetes Maternal Grandfather   . Cancer Maternal Grandfather     Bladder  . Diabetes Paternal Grandmother   . Cancer Maternal Aunt     Breast    Social History   Social History  . Marital Status: Single    Spouse Name: N/A  . Number of Children: 1  . Years of Education: N/A   Occupational History  . Supervisor     Social History Main Topics  . Smoking status: Current Every Day Smoker -- 0.50 packs/day    Types: Cigarettes    Start date: 06/06/1995  . Smokeless tobacco: Never Used  . Alcohol Use: No  . Drug Use: No  . Sexual Activity:    Partners: Male   Other Topics Concern  . None   Social History Narrative   Review of Systems - See HPI.  All other ROS are negative.  BP 113/71 mmHg  Pulse 91  Temp(Src) 98.1 F (36.7 C) (Oral)  Resp 16  Ht 5\' 5"  (1.651 m)  Wt 156 lb 8 oz (70.988 kg)  BMI 26.04 kg/m2  SpO2 99%  LMP 08/01/2015  Physical Exam    Constitutional: She is oriented to person, place, and time and well-developed, well-nourished, and in no distress.  HENT:  Head: Normocephalic and atraumatic.  Eyes: Conjunctivae are normal.  Neck: Neck supple. No thyromegaly present.  Cardiovascular: Normal rate, regular rhythm, normal heart sounds and intact distal pulses.   Pulmonary/Chest: Effort normal and breath sounds normal. No respiratory distress. She has no wheezes. She has no rales. She exhibits no tenderness.  Abdominal: Soft. Bowel sounds are normal. She exhibits no distension and no mass. There is no tenderness. There is no rebound and no guarding.  Neurological: She is alert and oriented to person, place, and time.  Skin: Skin is warm and dry. No rash noted.  Psychiatric: Affect normal.  Vitals reviewed.   No results found for this or any previous visit (from the past 2160 hour(s)).  Assessment/Plan: No problem-specific assessment & plan notes found for this encounter.

## 2015-09-16 NOTE — Patient Instructions (Signed)
Please go to the lab for blood work.  Increase fluids and make sure to eat a well-balanced diet. Please start a daily probiotic (I recommend Align). This will help replenish the gut with good bacteria.  We will treat based on lab results.

## 2015-09-16 NOTE — Progress Notes (Signed)
Pre visit review using our clinic review tool, if applicable. No additional management support is needed unless otherwise documented below in the visit note/SLS  

## 2015-09-16 NOTE — Assessment & Plan Note (Signed)
One episode of spotting last month. Normal menstrual periods prior and is not due for next at this time. Urine pregnancy performed and negative. Will continue to monitor. Reassurance given.

## 2015-09-16 NOTE — Assessment & Plan Note (Signed)
After a viral gastroenteritis. Exam unremarkable. Will check CBC, CMP, TSH, Vitamin D and stool culture. Start MTV and probiotic.

## 2015-09-21 ENCOUNTER — Telehealth: Payer: Self-pay | Admitting: *Deleted

## 2015-09-21 ENCOUNTER — Telehealth: Payer: Self-pay | Admitting: Physician Assistant

## 2015-09-21 MED ORDER — ERGOCALCIFEROL 1.25 MG (50000 UT) PO CAPS: 50000.0000 [IU] | ORAL_CAPSULE | ORAL | Status: DC

## 2015-09-21 NOTE — Telephone Encounter (Signed)
-----   Message from Waldon MerlWilliam C Martin, PA-C sent at 09/20/2015  9:07 PM EST ----- Labs good overall. Vitamin D low so we need to start a Rx. If she is willing ok to send in Rx for Ergocalciferol 50,0000 units once weekly x 10 weeks. Her WBC count is mildly elevated at 13.6. This can be due to inflammation or infection. Waiting on stool culture results -- has she brought sample in? If not, she needs to do so ASAP so we can assess for bacteria in the stool as a potential source of infection.

## 2015-09-21 NOTE — Telephone Encounter (Signed)
Patient informed, understood & agreed; she has not returned stool sample, reiterated importance of completing ASAP as provider stated in note, stated that she would take care of this and get it done, New Rx to [facility] per patient request/SLS

## 2015-09-21 NOTE — Telephone Encounter (Signed)
Note Copy & Pasted to Result note.

## 2015-09-21 NOTE — Telephone Encounter (Signed)
Caller name: Self   Can be reached: [phone]  Reason for call: [facility]

## 2015-09-23 ENCOUNTER — Ambulatory Visit: Payer: BLUE CROSS/BLUE SHIELD | Admitting: Physician Assistant

## 2015-09-23 DIAGNOSIS — Z0289 Encounter for other administrative examinations: Secondary | ICD-10-CM

## 2015-09-26 LAB — STOOL CULTURE

## 2015-09-28 ENCOUNTER — Telehealth: Payer: Self-pay | Admitting: Physician Assistant

## 2015-09-28 NOTE — Telephone Encounter (Signed)
Charge. 

## 2015-09-28 NOTE — Telephone Encounter (Signed)
Pt was no show 09/23/15 3:00pm for acute appt, pt did not reschedule, charge or no charge?

## 2015-10-21 ENCOUNTER — Telehealth: Payer: Self-pay | Admitting: Physician Assistant

## 2015-12-07 NOTE — Telephone Encounter (Signed)
flu

## 2015-12-14 ENCOUNTER — Ambulatory Visit (INDEPENDENT_AMBULATORY_CARE_PROVIDER_SITE_OTHER): Payer: BLUE CROSS/BLUE SHIELD | Admitting: Physician Assistant

## 2015-12-14 ENCOUNTER — Encounter: Payer: Self-pay | Admitting: Physician Assistant

## 2015-12-14 ENCOUNTER — Ambulatory Visit (HOSPITAL_BASED_OUTPATIENT_CLINIC_OR_DEPARTMENT_OTHER): Payer: BLUE CROSS/BLUE SHIELD

## 2015-12-14 VITALS — BP 116/65 | HR 84 | Temp 98.2°F | Ht 65.0 in | Wt 155.6 lb

## 2015-12-14 DIAGNOSIS — M25542 Pain in joints of left hand: Secondary | ICD-10-CM | POA: Diagnosis not present

## 2015-12-14 DIAGNOSIS — M25571 Pain in right ankle and joints of right foot: Secondary | ICD-10-CM | POA: Diagnosis not present

## 2015-12-14 DIAGNOSIS — M7989 Other specified soft tissue disorders: Secondary | ICD-10-CM | POA: Diagnosis not present

## 2015-12-14 DIAGNOSIS — M25572 Pain in left ankle and joints of left foot: Secondary | ICD-10-CM | POA: Diagnosis not present

## 2015-12-14 DIAGNOSIS — M25541 Pain in joints of right hand: Secondary | ICD-10-CM

## 2015-12-14 LAB — CK: CK TOTAL: 83 U/L (ref 7–177)

## 2015-12-14 LAB — COMPREHENSIVE METABOLIC PANEL
ALBUMIN: 4.1 g/dL (ref 3.5–5.2)
ALT: 11 U/L (ref 0–35)
AST: 14 U/L (ref 0–37)
Alkaline Phosphatase: 63 U/L (ref 39–117)
BUN: 14 mg/dL (ref 6–23)
CALCIUM: 9.5 mg/dL (ref 8.4–10.5)
CHLORIDE: 106 meq/L (ref 96–112)
CO2: 24 mEq/L (ref 19–32)
CREATININE: 0.74 mg/dL (ref 0.40–1.20)
GFR: 92.76 mL/min (ref >60.00)
Glucose, Bld: 87 mg/dL (ref 70–99)
Potassium: 4 mEq/L (ref 3.5–5.1)
Sodium: 139 mEq/L (ref 135–145)
Total Bilirubin: 0.2 mg/dL (ref 0.2–1.2)
Total Protein: 6.9 g/dL (ref 6.0–8.3)

## 2015-12-14 LAB — CBC
HEMATOCRIT: 43.1 % (ref 36.0–46.0)
HEMOGLOBIN: 14.1 g/dL (ref 12.0–15.0)
MCHC: 32.8 g/dL (ref 30.0–36.0)
MCV: 89.9 fl (ref 78.0–100.0)
PLATELETS: 347 10*3/uL (ref 150.0–400.0)
RBC: 4.79 Mil/uL (ref 3.87–5.11)
RDW: 13.6 % (ref 11.5–15.5)
WBC: 13 10*3/uL — ABNORMAL HIGH (ref 4.0–10.5)

## 2015-12-14 LAB — RHEUMATOID FACTOR

## 2015-12-14 LAB — SEDIMENTATION RATE: SED RATE: 7 mm/h (ref 0–22)

## 2015-12-14 MED ORDER — CELECOXIB 100 MG PO CAPS: 100.0000 mg | ORAL_CAPSULE | Freq: Two times a day (BID) | ORAL | Status: AC

## 2015-12-14 NOTE — Assessment & Plan Note (Signed)
And ankles bilaterally. Unclear etiology. Significant family history of autoimmune disorders. Will check CBC, CMP, CK, RF and ESR. Will start Celebrex as directed. Follow-up 2 weeks.

## 2015-12-14 NOTE — Patient Instructions (Signed)
Please go to the lab for blood work.  I will call you with your results. Go downstairs to imaging at 6:15 for your ultrasound.  They will call me with your results before letting you leave.  Please take the Celebrex as directed.  Follow-up 2 weeks.

## 2015-12-14 NOTE — Assessment & Plan Note (Signed)
Will check STAT US to r/o DVT.

## 2015-12-14 NOTE — Progress Notes (Signed)
Patient with history of bilateral knee OA and spinal stenosis presents to clinic c/o significant pain and discomfort intermittently in bilateral ankles and wrists/hands over the past 2 weeks.  Denies new medications or herbal supplements. Denies change to diet.  Denies erythema or swelling of joints. Denies fever or dark urine.  Patient does note chronic mild swelling of R lower extremity since adolescence that has always been asymptomatic. Notes increased swelling over the past 2 weeks.  Past Medical History  Diagnosis Date  . Arthritis     In feet    No current outpatient prescriptions on file prior to visit.   No current facility-administered medications on file prior to visit.    Allergies  Allergen Reactions  . Tape Rash    Medical-Bandaids    Family History  Problem Relation Age of Onset  . Diabetes Mother   . Lupus Mother   . Colitis Mother   . Rheum arthritis Mother   . Stroke Father   . Colitis Brother   . ADD / ADHD Son   . Arthritis Maternal Grandmother   . Cirrhosis Maternal Grandmother   . Diabetes Maternal Grandfather   . Cancer Maternal Grandfather     Bladder  . Diabetes Paternal Grandmother   . Cancer Maternal Aunt     Breast    Social History   Social History  . Marital Status: Single    Spouse Name: N/A  . Number of Children: 1  . Years of Education: N/A   Occupational History  . Supervisor     Social History Main Topics  . Smoking status: Current Every Day Smoker -- 0.50 packs/day    Types: Cigarettes    Start date: 06/06/1995  . Smokeless tobacco: Never Used  . Alcohol Use: No  . Drug Use: No  . Sexual Activity:    Partners: Male   Other Topics Concern  . None   Social History Narrative   Review of Systems - See HPI.  All other ROS are negative.  BP 116/65 mmHg  Pulse 84  Temp(Src) 98.2 F (36.8 C) (Oral)  Ht 5' 5"  (1.651 m)  Wt 155 lb 9.6 oz (70.58 kg)  BMI 25.89 kg/m2  SpO2 100%  LMP 11/25/2015  Physical Exam    Constitutional: She is oriented to person, place, and time and well-developed, well-nourished, and in no distress.  HENT:  Head: Normocephalic and atraumatic.  Eyes: Conjunctivae are normal.  Cardiovascular: Normal rate, regular rhythm, normal heart sounds and intact distal pulses.   Pulses:      Popliteal pulses are 2+ on the right side, and 2+ on the left side.       Dorsalis pedis pulses are 2+ on the right side, and 2+ on the left side.       Posterior tibial pulses are 2+ on the right side, and 2+ on the left side.  RLE with calf swelling. Negative Homan Sign.  Pulmonary/Chest: Effort normal and breath sounds normal. No respiratory distress. She has no wheezes. She has no rales. She exhibits no tenderness.  Neurological: She is alert and oriented to person, place, and time.  Skin: Skin is warm and dry. No rash noted.  Psychiatric: Affect normal.  Vitals reviewed.   Recent Results (from the past 2160 hour(s))  CBC     Status: Abnormal   Collection Time: 09/16/15  2:15 PM  Result Value Ref Range   WBC 13.6 (H) 4.0 - 10.5 K/uL   RBC 4.43  3.87 - 5.11 Mil/uL   Platelets 348.0 150.0 - 400.0 K/uL   Hemoglobin 13.3 12.0 - 15.0 g/dL   HCT 40.6 36.0 - 46.0 %   MCV 91.8 78.0 - 100.0 fl   MCHC 32.7 30.0 - 36.0 g/dL   RDW 13.7 11.5 - 15.5 %  Comp Met (CMET)     Status: Abnormal   Collection Time: 09/16/15  2:15 PM  Result Value Ref Range   Sodium 140 135 - 145 mEq/L   Potassium 3.9 3.5 - 5.1 mEq/L   Chloride 108 96 - 112 mEq/L   CO2 27 19 - 32 mEq/L   Glucose, Bld 104 (H) 70 - 99 mg/dL   BUN 12 6 - 23 mg/dL   Creatinine, Ser 0.72 0.40 - 1.20 mg/dL   Total Bilirubin 0.2 0.2 - 1.2 mg/dL   Alkaline Phosphatase 64 39 - 117 U/L   AST 12 0 - 37 U/L   ALT 12 0 - 35 U/L   Total Protein 6.3 6.0 - 8.3 g/dL   Albumin 3.8 3.5 - 5.2 g/dL   Calcium 9.3 8.4 - 10.5 mg/dL   GFR 95.86 >60.00 mL/min  TSH     Status: None   Collection Time: 09/16/15  2:15 PM  Result Value Ref Range   TSH 1.11  0.35 - 4.50 uIU/mL  Vitamin D (25 hydroxy)     Status: Abnormal   Collection Time: 09/16/15  2:15 PM  Result Value Ref Range   VITD 15.73 (L) 30.00 - 100.00 ng/mL  POCT urine pregnancy     Status: Normal   Collection Time: 09/16/15  5:01 PM  Result Value Ref Range   Preg Test, Ur Negative Negative  Stool Culture     Status: None   Collection Time: 09/22/15  9:46 AM  Result Value Ref Range   Organism ID, Bacteria No Salmonella,Shigella,Campylobacter,Yersinia,or    Organism ID, Bacteria No E.coli [id number] isolated.     Assessment/Plan: Arthralgia of both hands And ankles bilaterally. Unclear etiology. Significant family history of autoimmune disorders. Will check CBC, CMP, CK, RF and ESR. Will start Celebrex as directed. Follow-up 2 weeks.  Right leg swelling Will check STAT US to r/o DVT.

## 2015-12-14 NOTE — Progress Notes (Signed)
Pre visit review using our clinic review tool, if applicable. No additional management support is needed unless otherwise documented below in the visit note. 

## 2015-12-15 ENCOUNTER — Telehealth: Payer: Self-pay | Admitting: Physician Assistant

## 2015-12-15 ENCOUNTER — Ambulatory Visit (HOSPITAL_BASED_OUTPATIENT_CLINIC_OR_DEPARTMENT_OTHER)
Admission: RE | Admit: 2015-12-15 | Discharge: 2015-12-15 | Disposition: A | Discharge status: Home / Self Care | Payer: BLUE CROSS/BLUE SHIELD | Source: Ambulatory Visit | Attending: Physician Assistant | Admitting: Physician Assistant

## 2015-12-15 DIAGNOSIS — M7989 Other specified soft tissue disorders: Secondary | ICD-10-CM | POA: Insufficient documentation

## 2015-12-15 LAB — ANA: Anti Nuclear Antibody(ANA): POSITIVE — AB

## 2015-12-15 LAB — ANTI-NUCLEAR AB-TITER (ANA TITER)

## 2015-12-15 NOTE — Telephone Encounter (Signed)
Please inform patient that her [place] was negative for DVT. They did mention if there was concern for a clot higher up in the leg to consider CT imaging. Giving history of chronic swelling since youth, I am not really suspicious that there is a clot there but if she is wanting to investigate then I will order further testing. Still waiting on final lab results but will call when they are in -- likely tomorrow.

## 2015-12-15 NOTE — Telephone Encounter (Signed)
Pt aware of results and verbalized understanding. Pt declines further workup at this time.

## 2015-12-18 ENCOUNTER — Other Ambulatory Visit: Payer: Self-pay | Admitting: Physician Assistant

## 2015-12-18 DIAGNOSIS — M2550 Pain in unspecified joint: Secondary | ICD-10-CM

## 2015-12-18 DIAGNOSIS — R768 Other specified abnormal immunological findings in serum: Secondary | ICD-10-CM

## 2015-12-21 ENCOUNTER — Encounter: Payer: Self-pay | Admitting: Physician Assistant

## 2015-12-28 ENCOUNTER — Ambulatory Visit: Payer: BLUE CROSS/BLUE SHIELD | Admitting: Physician Assistant

## 2018-01-02 IMAGING — US US EXTREM LOW VENOUS*R*
1 series · 13 of 24 positions shown · non-contrast
Comparison: None.

CLINICAL DATA: 39-year-old female with a history of right leg
swelling



[Series 1: us extrem low venous*right* · 0.06mm/px · 13 of 27 slices shown]
[im 1/27]
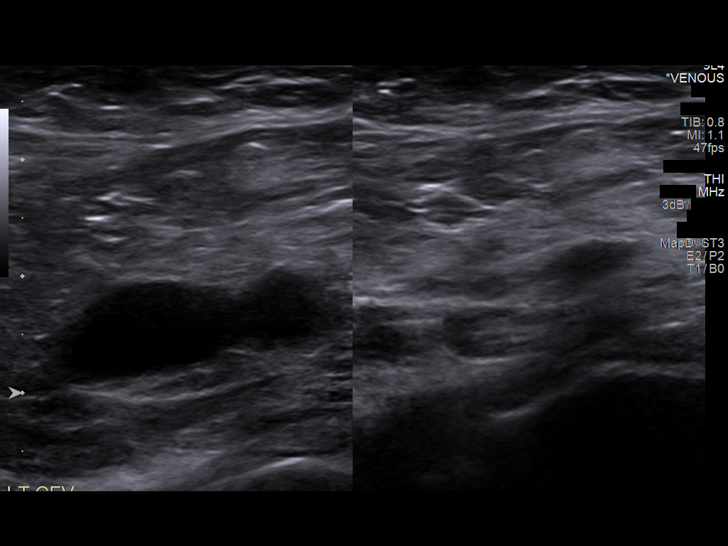
[im 3/27]
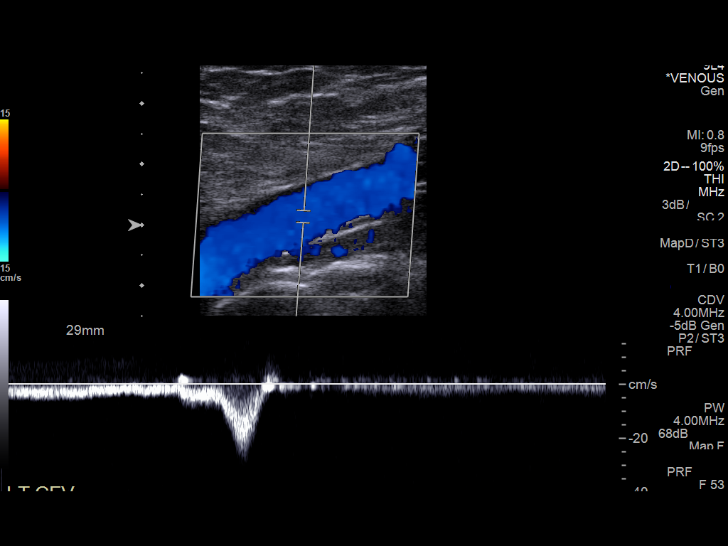
[im 5/27]
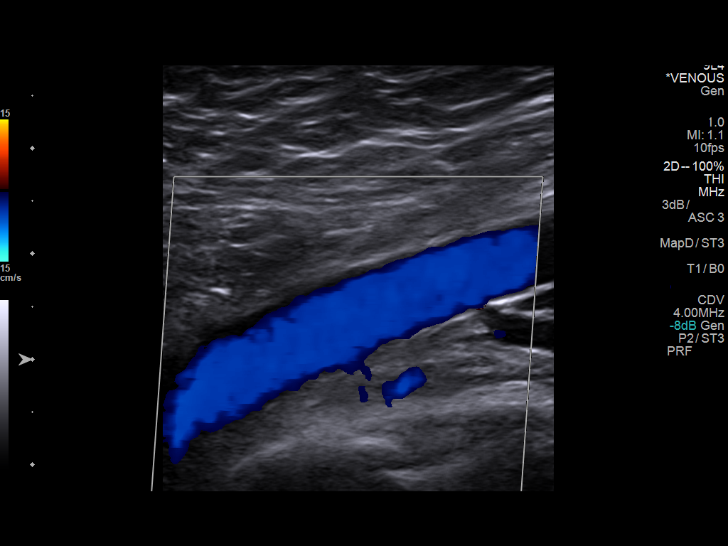
[im 7/27]
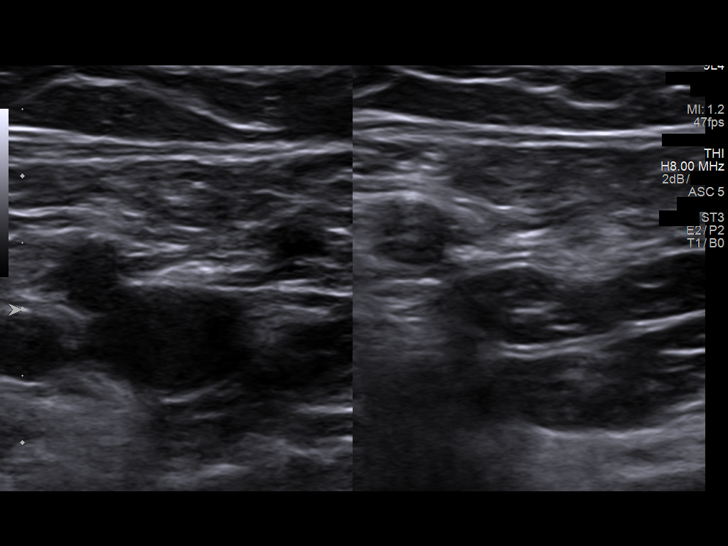
[im 10/27]
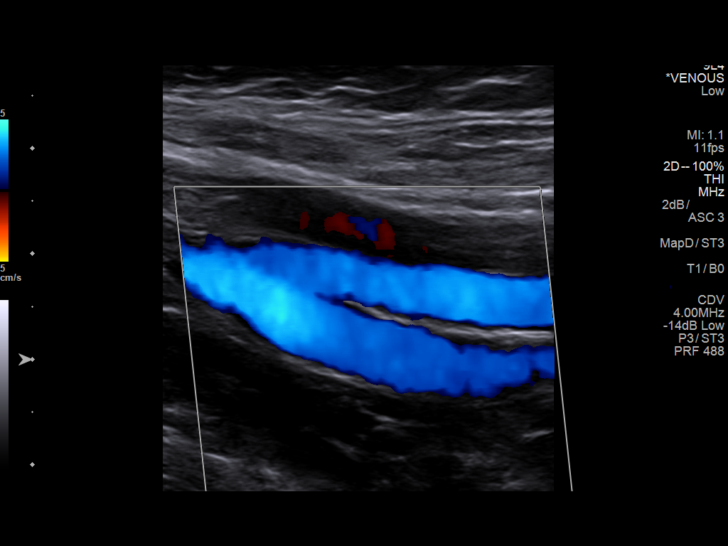
[im 12/27]
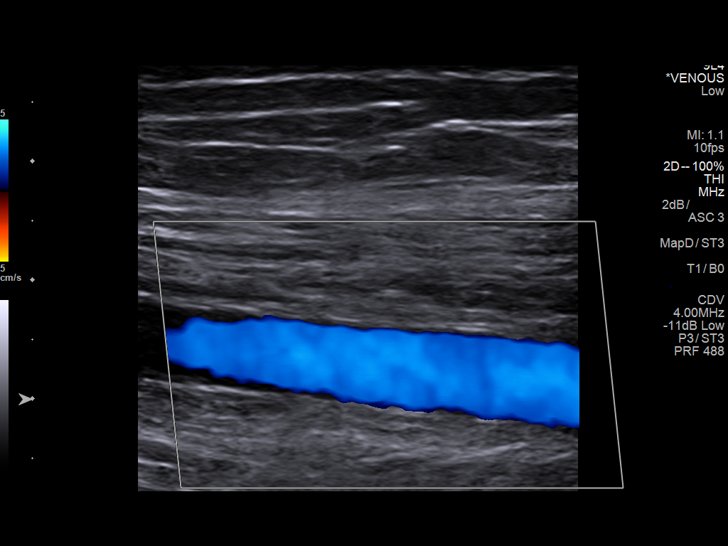
[im 14/27]
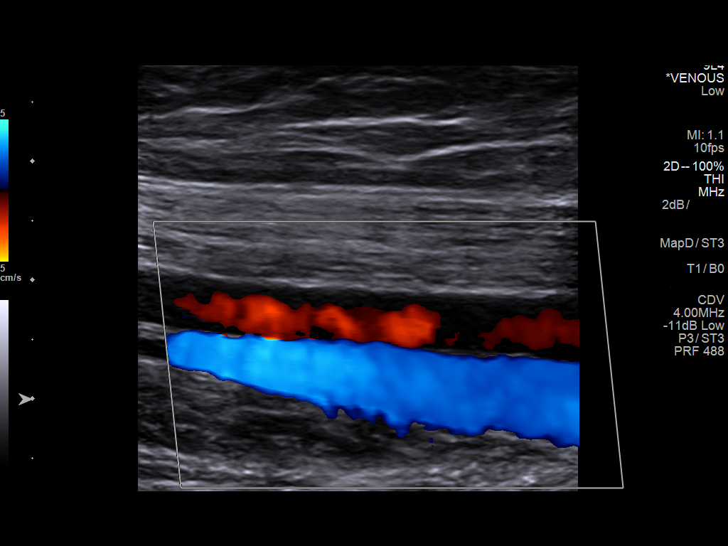
[im 15/27]
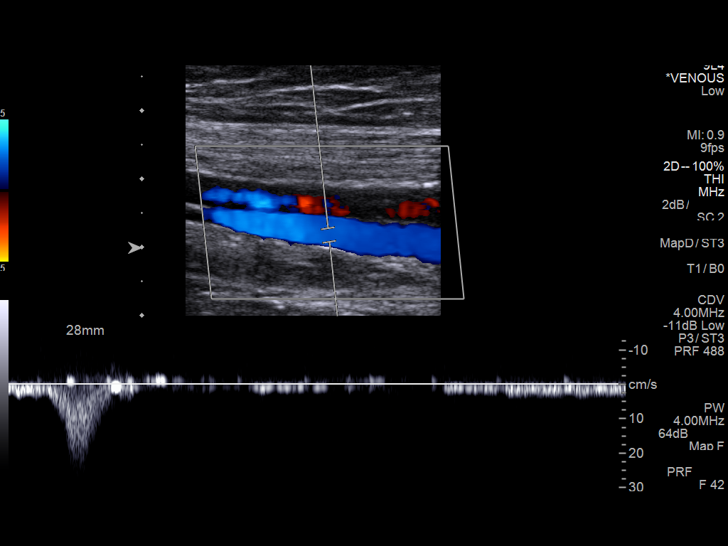
[im 17/27]
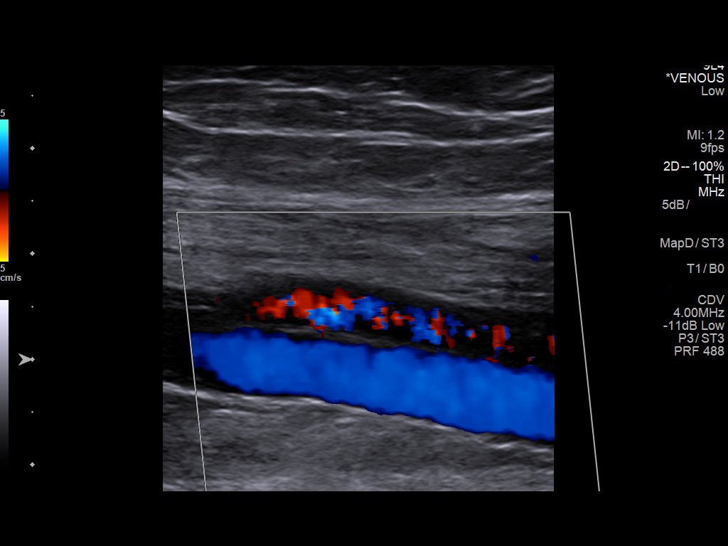
[im 20/27]
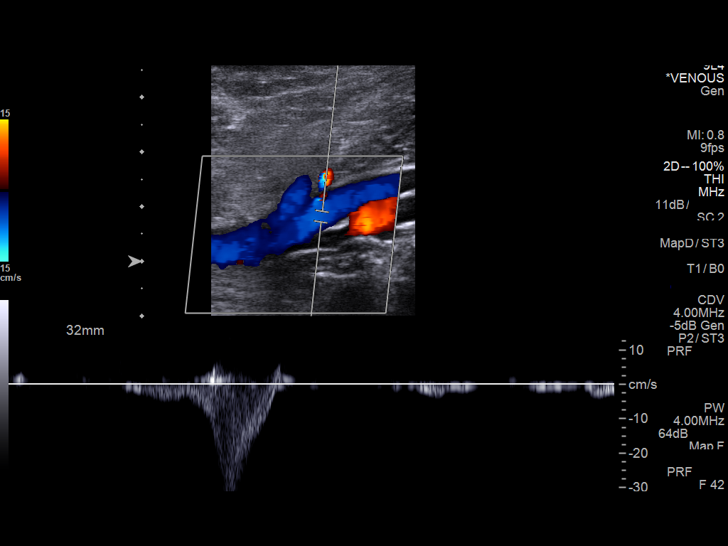
[im 22/27]
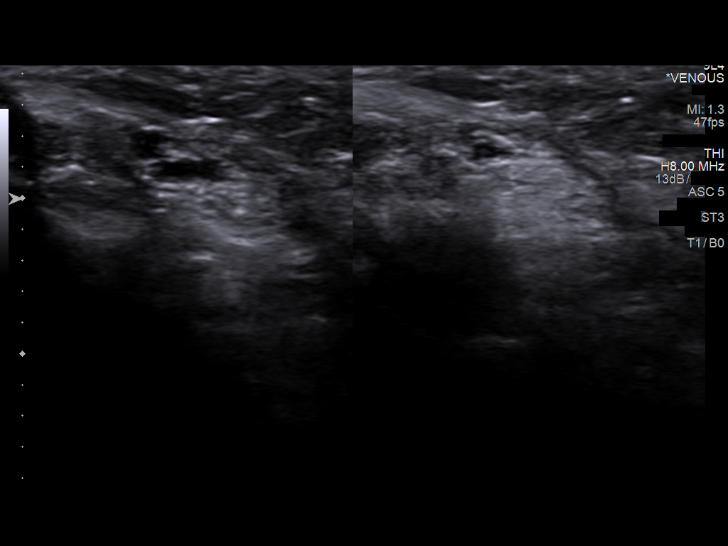
[im 24/27]
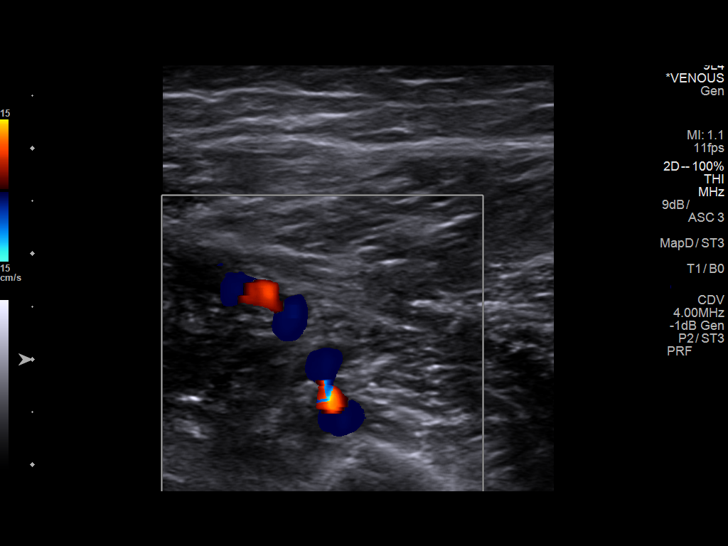
[im 27/27]
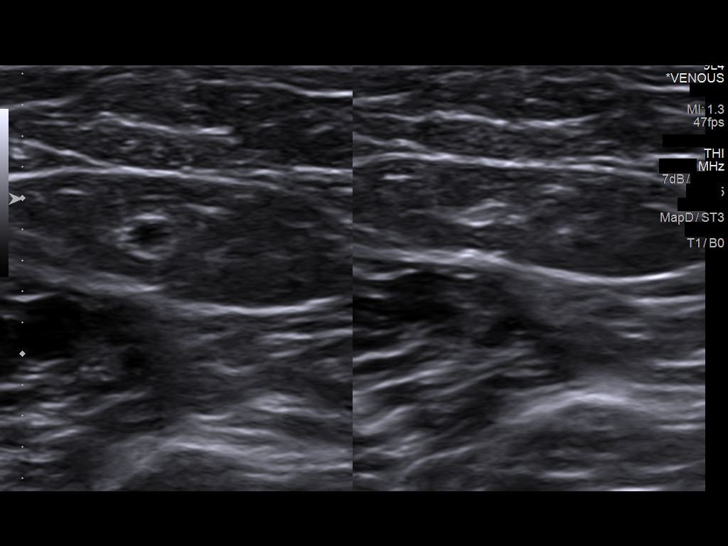

[13 of 24 positions shown; findings below may reference images not displayed]

FINDINGS: Contralateral Common Femoral Vein: Loss of respiratory phasicity of
the left common femoral waveform. No evidence of common femoral
thrombus.

Common Femoral Vein: No evidence of thrombus. Normal
compressibility, respiratory phasicity and response to augmentation.

Saphenofemoral Junction: No evidence of thrombus. Normal
compressibility and flow on color Doppler imaging.

Profunda Femoral Vein: No evidence of thrombus. Normal
compressibility and flow on color Doppler imaging.

Femoral Vein: No evidence of thrombus. Normal compressibility,
respiratory phasicity and response to augmentation.

Popliteal Vein: No evidence of thrombus. Normal compressibility,
respiratory phasicity and response to augmentation.

Calf Veins: No evidence of thrombus. Normal compressibility and flow
on color Doppler imaging.

Superficial Great Saphenous Vein: No evidence of thrombus. Normal
compressibility and flow on color Doppler imaging.

Other Findings:  None.
IMPRESSION: Sonographic survey of the right lower extremity negative for DVT.

Loss of the expected respiratory phasicity at the left common
femoral vein. If there is concern for more proximal/iliac occlusion
further evaluation with cross-sectional imaging (such as CT the or
MRV) may be considered.

## 2018-04-24 ENCOUNTER — Encounter: Payer: Self-pay | Admitting: Emergency Medicine
